# Patient Record
Sex: Male | Born: 1955 | Marital: Single | Smoking: Never smoker
Health system: Southern US, Community
[De-identification: ages and names within clinical notes are randomized; demographics above are authoritative.]

---

## 2007-05-09 ENCOUNTER — Emergency Department: Payer: Self-pay | Admitting: Emergency Medicine

## 2008-03-17 ENCOUNTER — Emergency Department: Payer: Self-pay | Admitting: Emergency Medicine

## 2008-03-19 ENCOUNTER — Emergency Department: Payer: Self-pay | Admitting: Emergency Medicine

## 2008-07-20 ENCOUNTER — Emergency Department: Payer: Self-pay | Admitting: Emergency Medicine

## 2008-09-11 IMAGING — CR DG CHEST 2V
1 series · 2 of 2 positions shown · non-contrast
Comparison: none

REASON FOR EXAM: pain
COMMENTS:

PROCEDURE:     DXR - DXR CHEST PA (OR AP) AND LATERAL  - May 09, 2007  [DATE]
RESULT:     No fracture, dislocation or other acute bony abnormality is
identified. No pneumothorax is seen. The chest appears mildly hyperexpanded
suspicious for history of asthma or COPD.

[Series 1: view not recorded · 0.17mm/px · 2 of 2 slices shown]
[im 1/2]
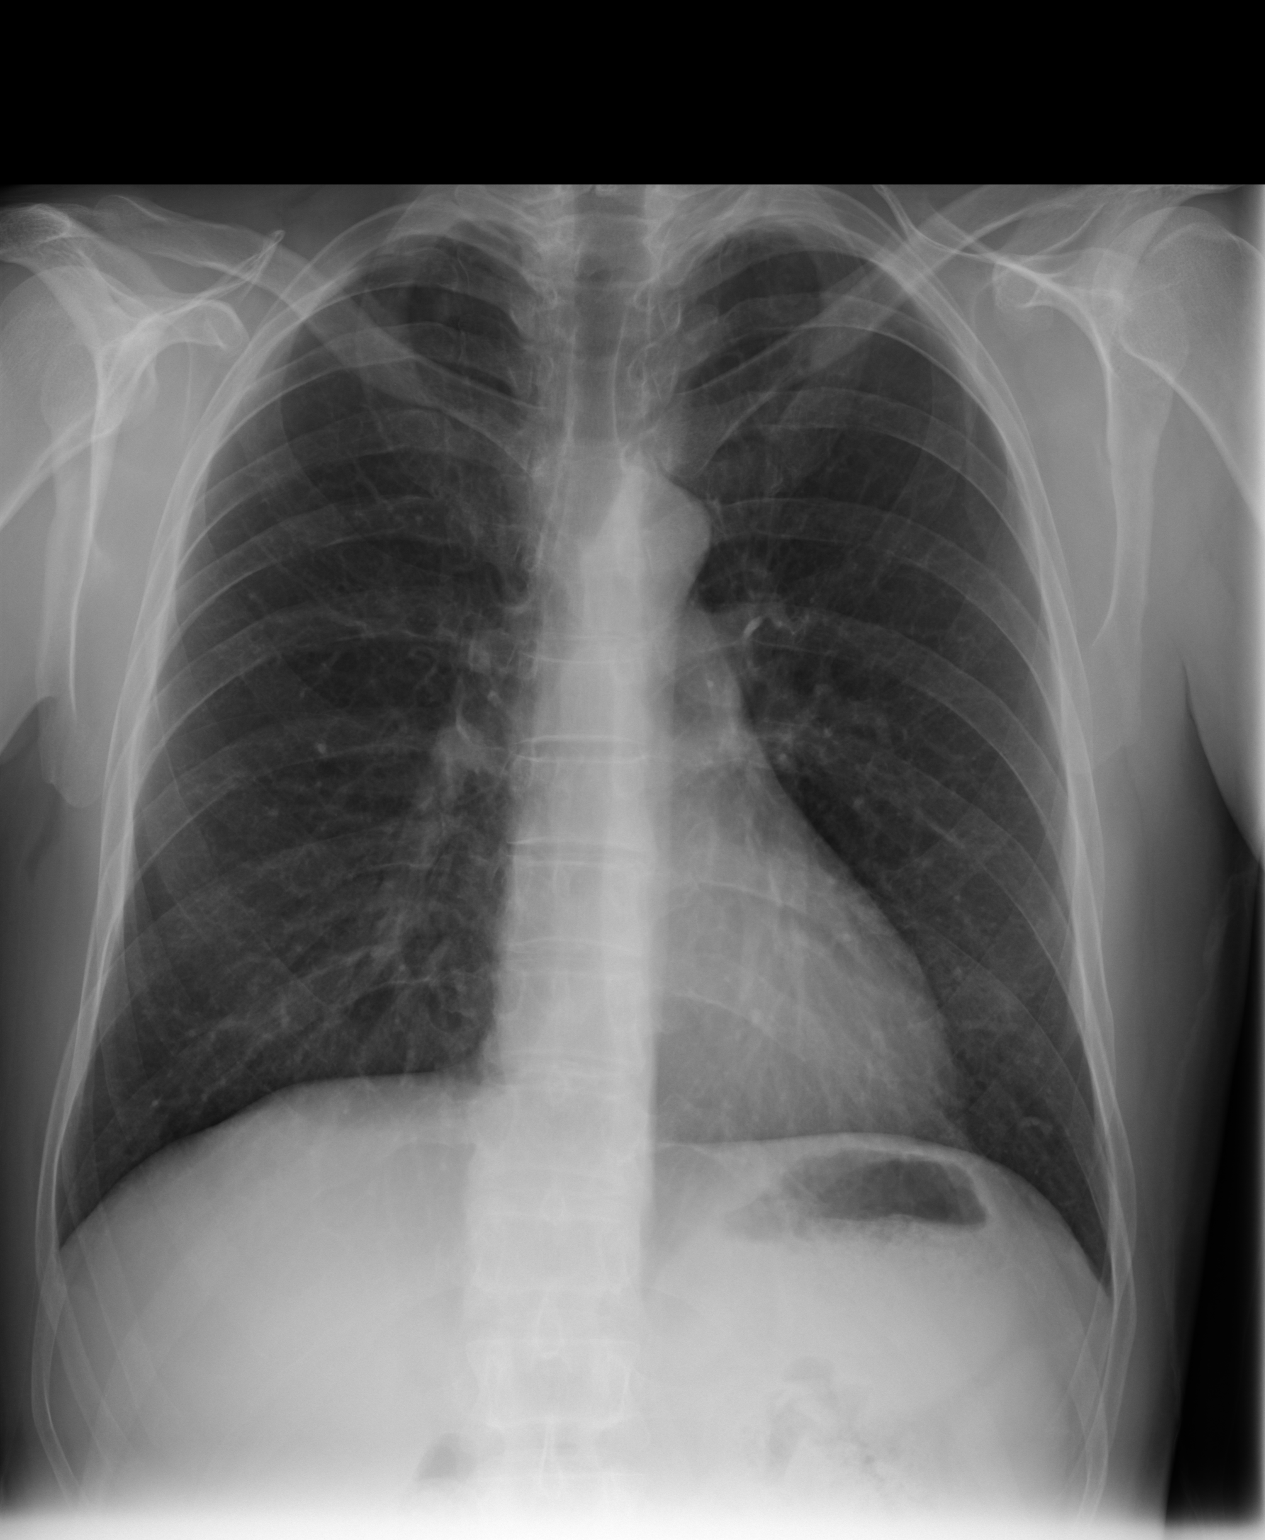
[im 2/2]
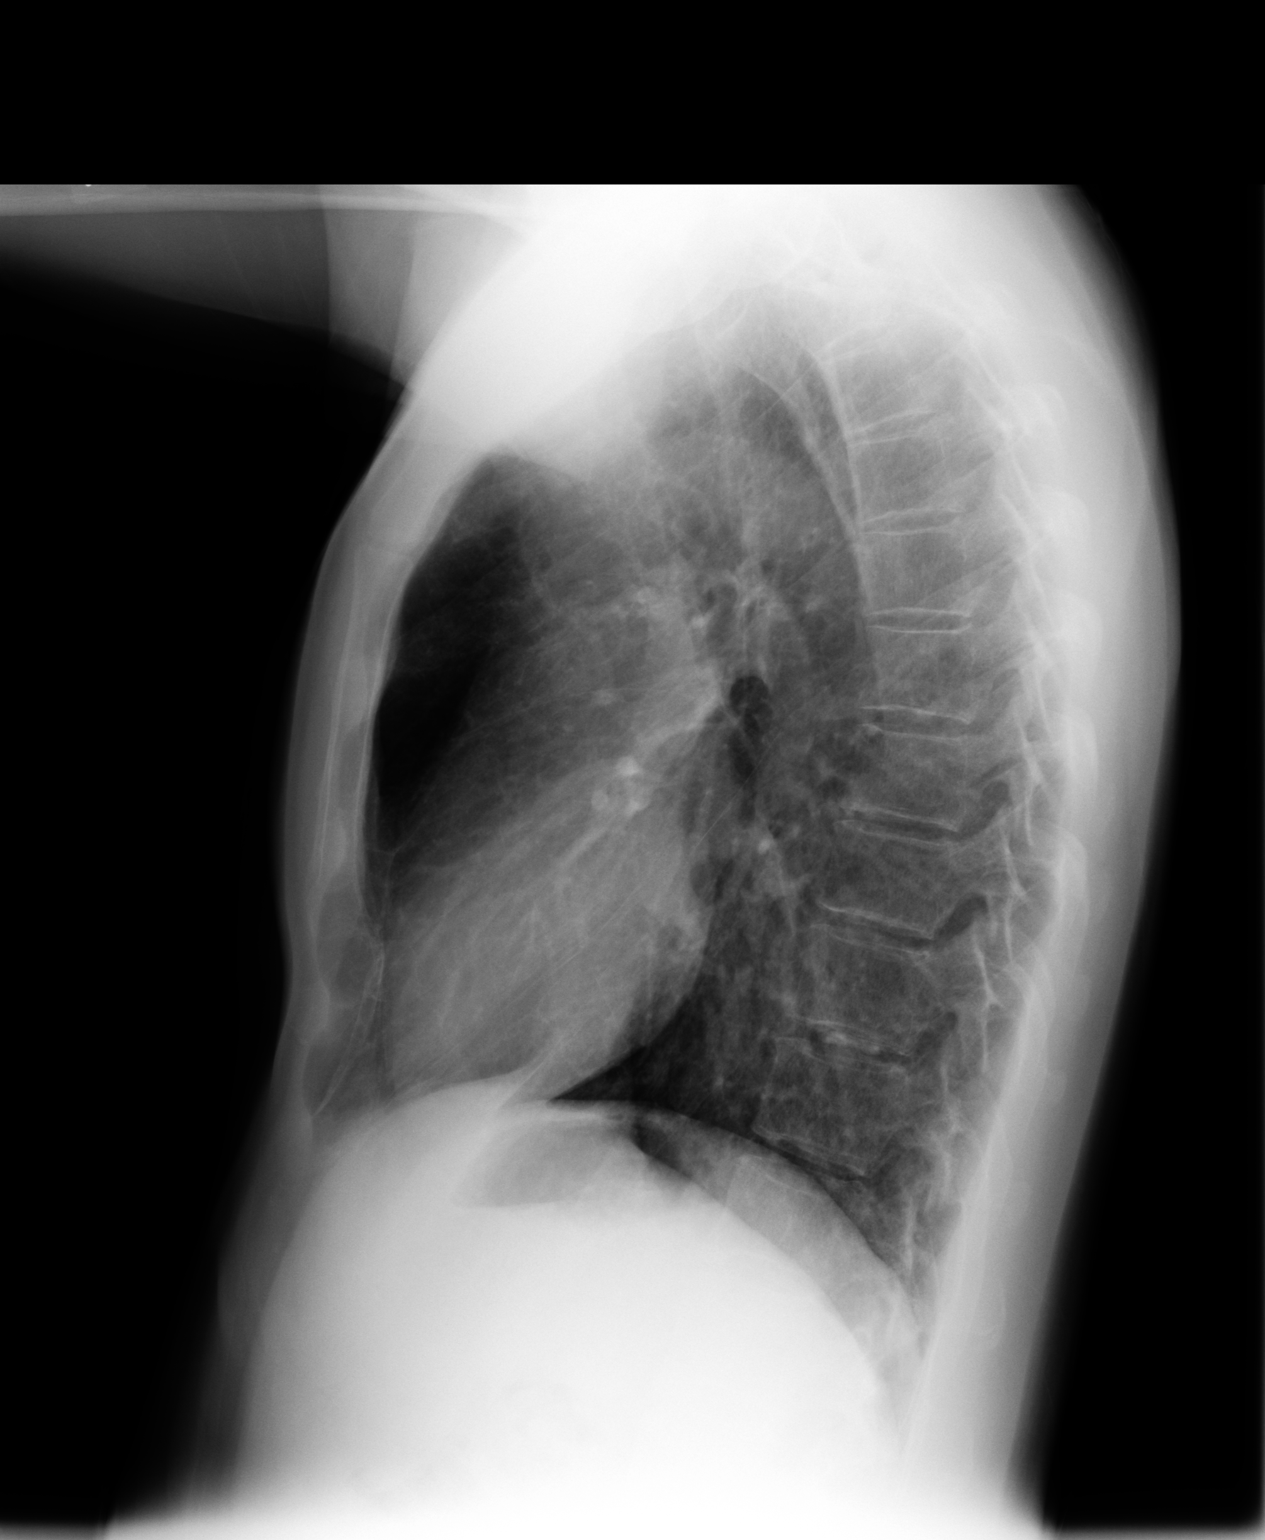

[2 of 2 positions shown; findings below may reference images not displayed]

IMPRESSION: 1.     No acute changes are identified.
2.     The chest appears mildly hyperexpanded.

## 2009-11-23 IMAGING — CR DG ANKLE COMPLETE 3+V*L*
1 series · 5 of 5 positions shown · non-contrast
Comparison: none

REASON FOR EXAM: twisting injury
COMMENTS:

PROCEDURE:     DXR - DXR ANKLE LEFT COMPLETE  - July 20, 2008  [DATE]
RESULT:     Five views of the LEFT ankle reveal the joint mortise to be
preserved. The talar dome is intact. The overlying soft tissues are grossly
normal.

[Series 1: view not recorded · 0.17mm/px · 5 of 5 slices shown]
[im 1/5]
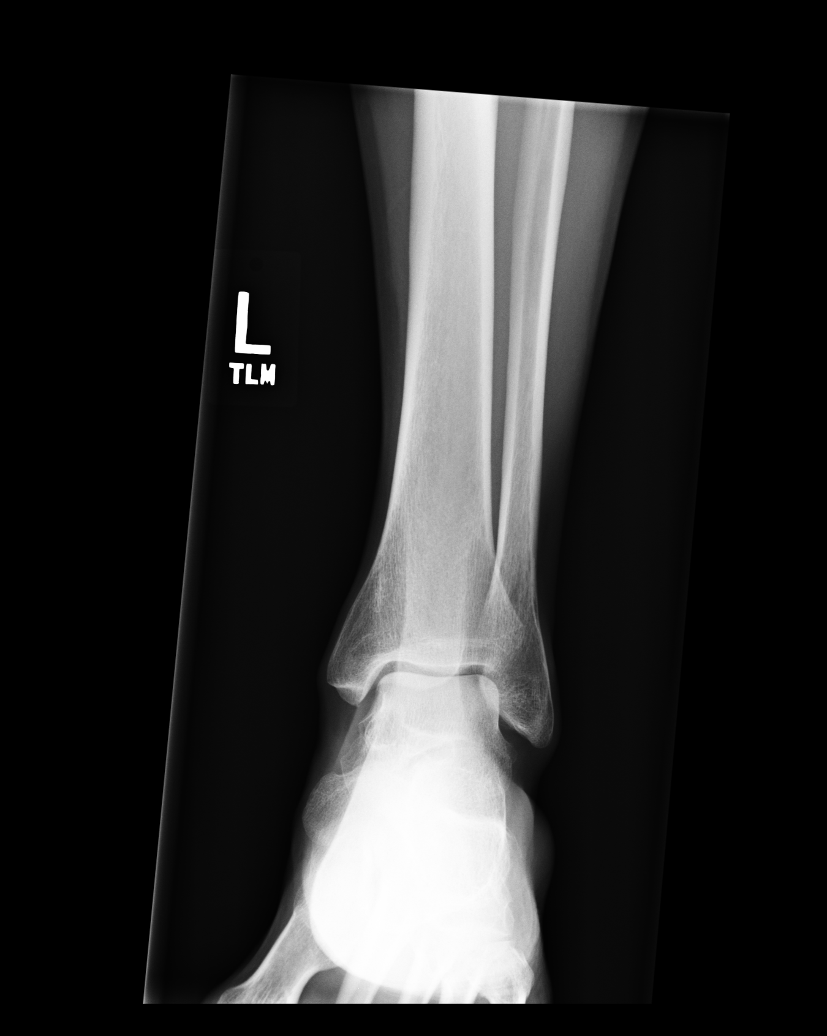
[im 2/5]
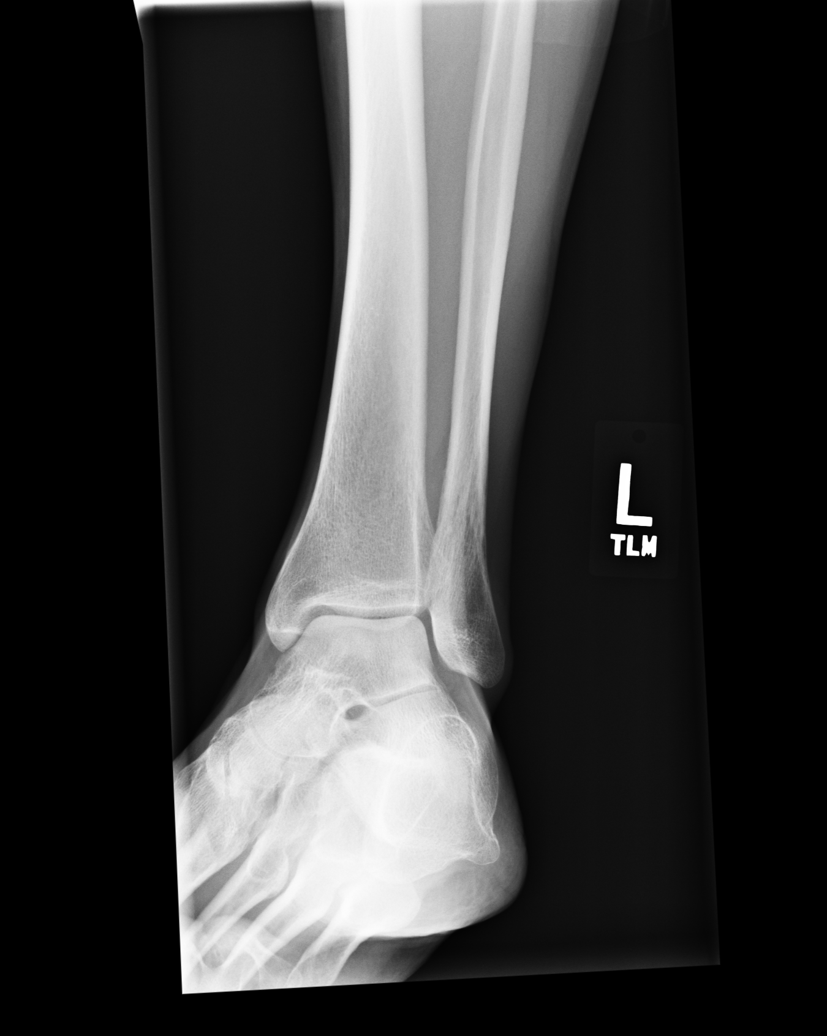
[im 3/5]
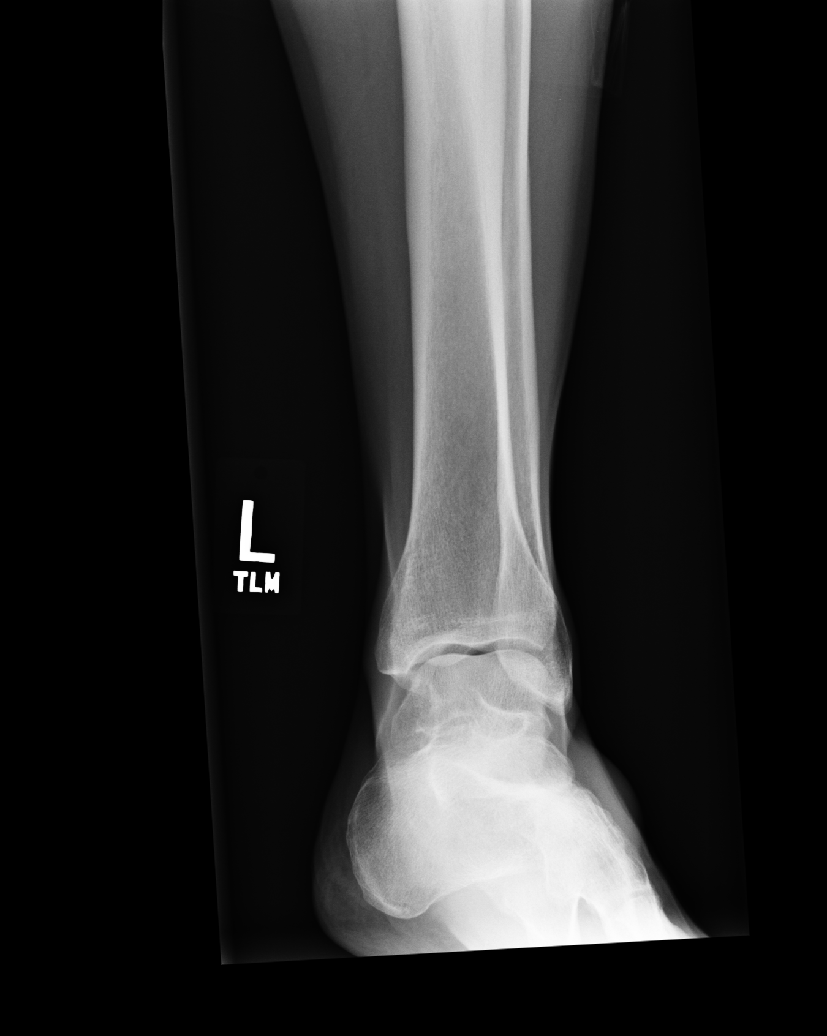
[im 4/5]
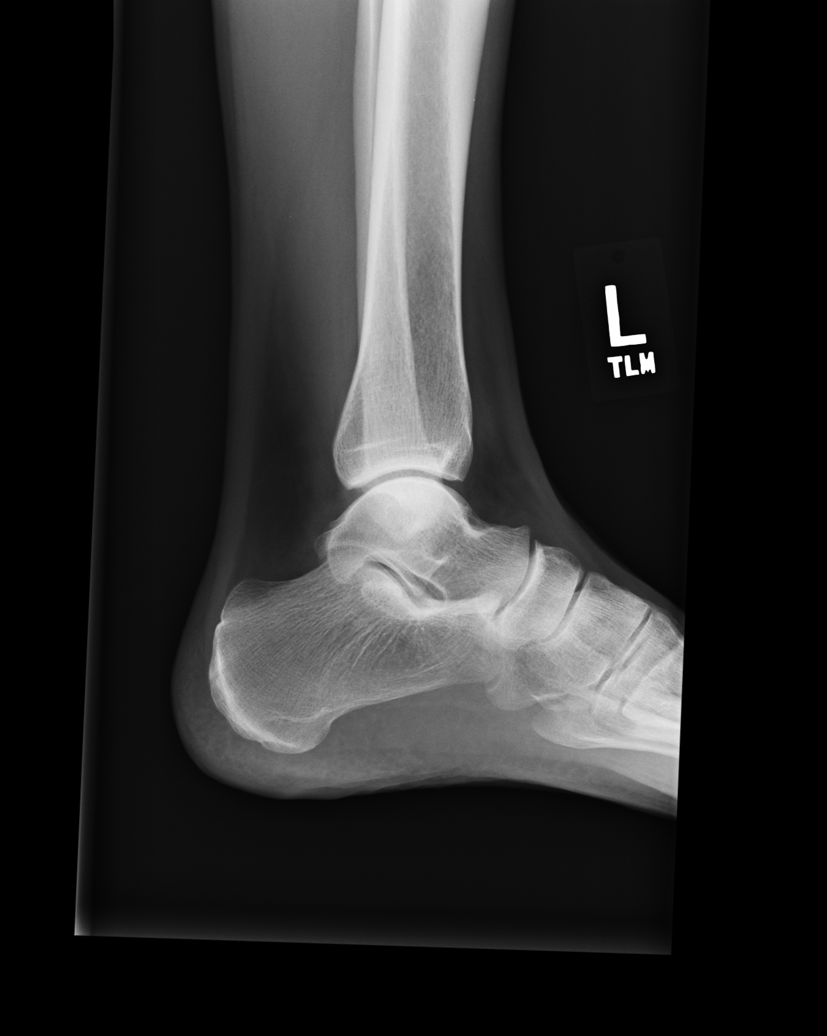
[im 5/5]
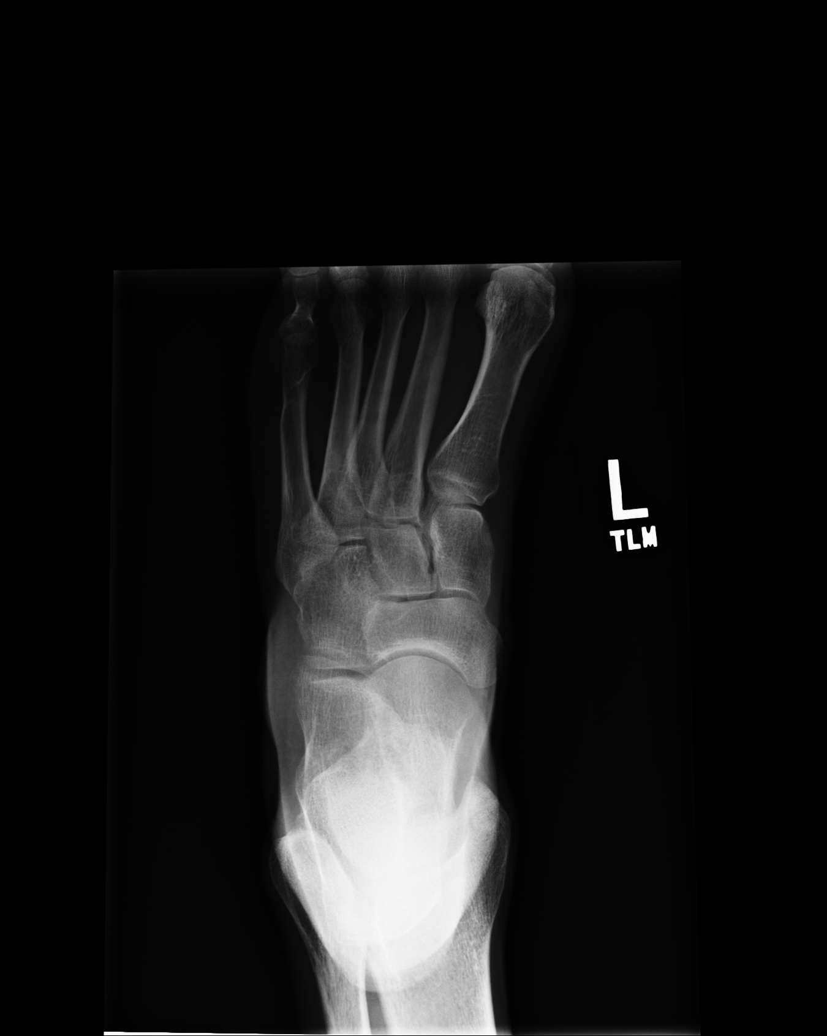

[5 of 5 positions shown; findings below may reference images not displayed]

IMPRESSION: I do not see objective evidence of an acute displaced
fracture of the ankle. Follow-up imaging is available if the patient's
symptoms do not resolve in a fashion consistent with an uncomplicated sprain.

## 2011-11-18 ENCOUNTER — Other Ambulatory Visit: Payer: Self-pay | Admitting: Family Medicine

## 2011-11-18 ENCOUNTER — Ambulatory Visit: Payer: Self-pay

## 2011-11-18 ENCOUNTER — Ambulatory Visit: Payer: Self-pay | Admitting: Family Medicine

## 2011-11-18 VITALS — BP 147/80 | HR 66 | Temp 97.8°F | Resp 16 | Ht 68.5 in | Wt 132.0 lb

## 2011-11-18 DIAGNOSIS — M79645 Pain in left finger(s): Secondary | ICD-10-CM

## 2011-11-18 DIAGNOSIS — M79609 Pain in unspecified limb: Secondary | ICD-10-CM

## 2011-11-18 LAB — POCT CBC
Granulocyte percent: 57.7 %G (ref 37–80)
HCT, POC: 45.3 % (ref 43.5–53.7)
Hemoglobin: 15.2 g/dL (ref 14.1–18.1)
MPV: 8.9 fL (ref 0–99.8)
POC Granulocyte: 2 (ref 2–6.9)
POC LYMPH PERCENT: 31.3 %L (ref 10–50)
RDW, POC: 13.4 %

## 2011-11-18 NOTE — Progress Notes (Signed)
Subjective: Patient was fine yesterday when he. He did his usual routine, exercise et Karie Soda. When he locks his chart going into work he noticed pain in the left index finger. He has continued to hurt and PIP joint and the whole distal two thirds of the finger. There is erythema of the lateral aspect of the of the distal joint. He does not know how he could have hurt it.  Objective: Lateral aspect of the left index finger is erythematous overlying the DIP joint. It's very tender there, with some tenderness on down to the PIP joint. No other joints are flared. He was wearing a little splint on it today.  Assessment: Pain and redness left index finger, etiology unclear  Plan: CBC and x-ray.  [facility] reading (PRIMARY) by  Dr. Alwyn Ren  No bony abnormalities seen . Results for orders placed in visit on 11/18/11  POCT CBC      Component Value Range   WBC 3.5 (*) 4.6 - 10.2 (K/uL)   Lymph, poc 1.1  0.6 - 3.4    POC LYMPH PERCENT 31.3  10 - 50 (%L)   MID (cbc) 0.4  0 - 0.9    POC MID % 11.0  0 - 12 (%M)   POC Granulocyte 2.0  2 - 6.9    Granulocyte percent 57.7  37 - 80 (%G)   RBC 5.17  4.69 - 6.13 (M/uL)   Hemoglobin 15.2  14.1 - 18.1 (g/dL)   HCT, POC 86.5  78.4 - 53.7 (%)   MCV 87.7  80 - 97 (fL)   MCH, POC 29.4  27 - 31.2 (pg)   MCHC 33.6  31.8 - 35.4 (g/dL)   RDW, POC 69.6     Platelet Count, POC 272  142 - 424 (K/uL)   MPV 8.9  0 - 99.8 (fL)   We'll treat symptomatically

## 2011-11-18 NOTE — Patient Instructions (Signed)
Take ibuprofen for pain and inflammation. Continue to keep the finger splinted for relief of discomfort. If it gets worse it rechecked.

## 2013-02-10 ENCOUNTER — Emergency Department: Payer: Self-pay | Admitting: Emergency Medicine

## 2013-03-23 IMAGING — CR DG FINGER INDEX 2+V*L*
1 series · 1 of 1 positions shown · non-contrast
Comparison: None.

CLINICAL DATA: Pain in the left index finger.  Swelling.  Erythema.
No known injury.

LEFT HAND - 2 VIEW

[PA]
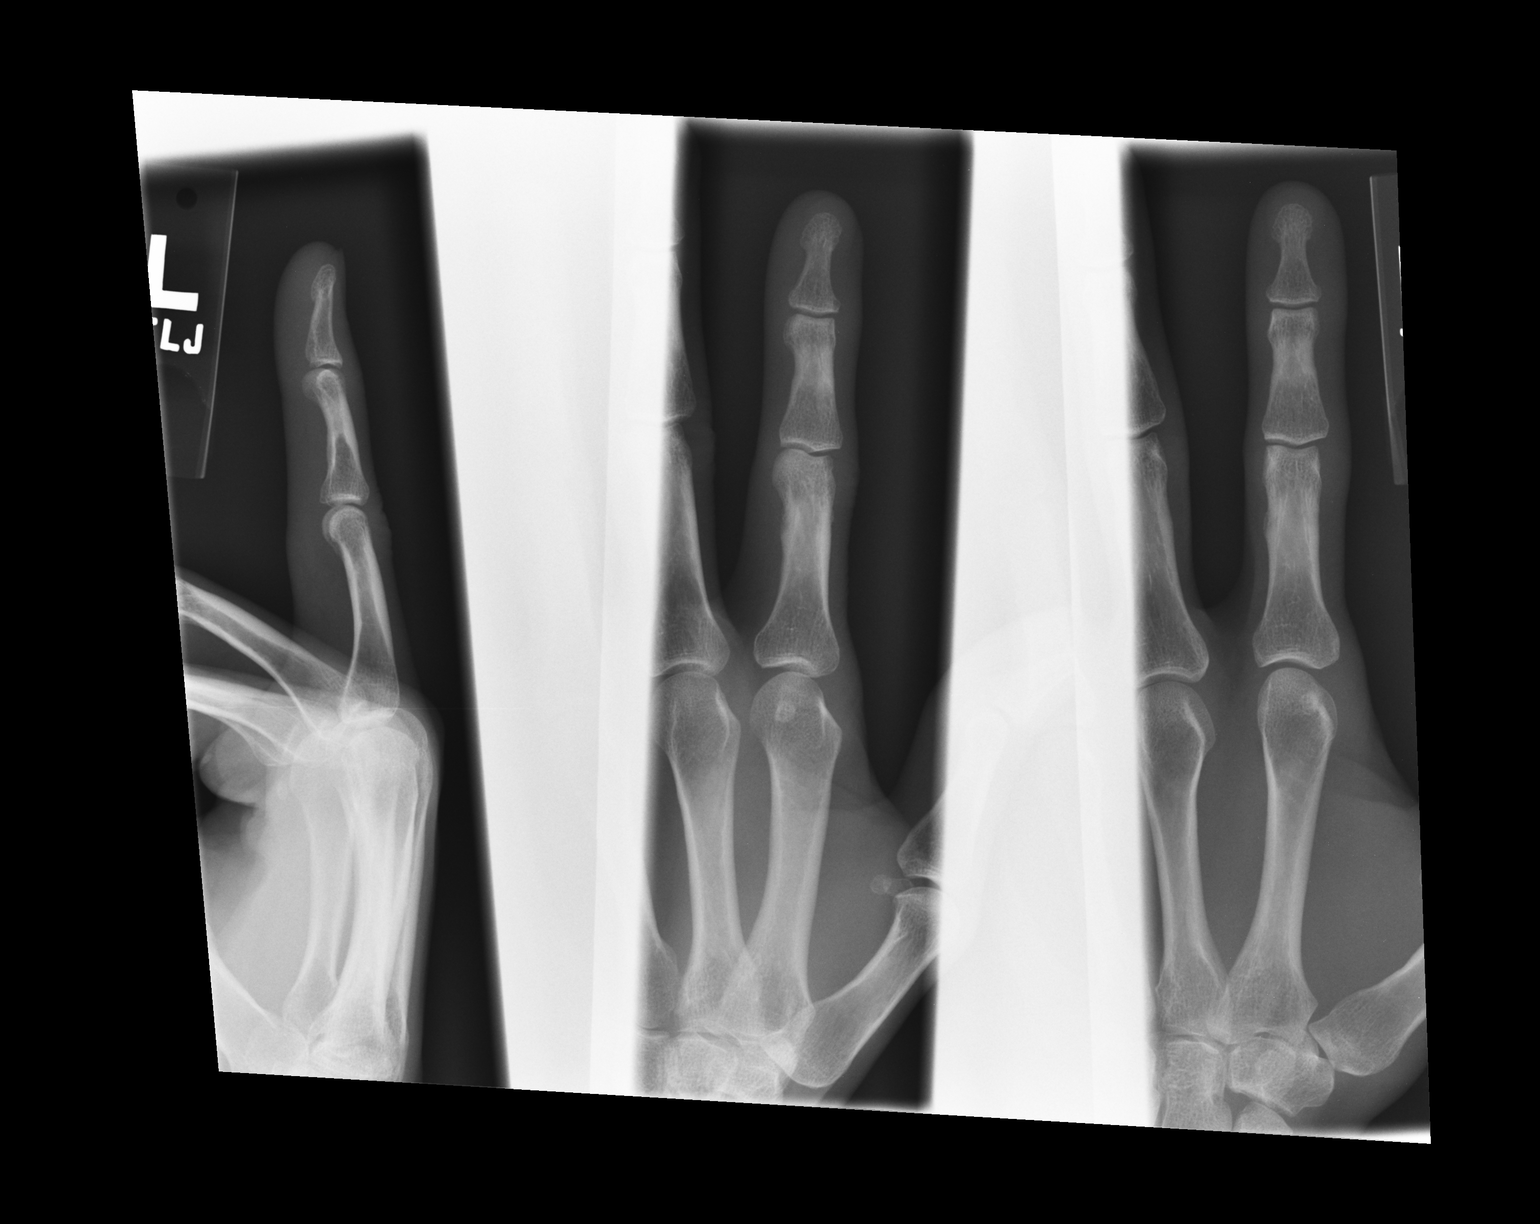

[1 of 1 positions shown; findings below may reference images not displayed]

FINDINGS: Alignment is normal.  Joint spaces are preserved.  No
fracture or dislocation is evident.  No soft tissue lesions are
seen.  No opaque foreign body is evident peri
IMPRESSION: No abnormality is identified.

## 2015-05-15 ENCOUNTER — Telehealth: Payer: Self-pay

## 2015-05-15 NOTE — Telephone Encounter (Signed)
Pt states we are not his primary care. Can we do this for pt?

## 2015-05-15 NOTE — Telephone Encounter (Signed)
Left message on machine advising pt

## 2015-05-15 NOTE — Telephone Encounter (Signed)
If patient has a PCP or specialist managing the issue that may result in the patient's death, that person would be more appropriate.  However, if the patient wants to discuss the possibility of [place] completing the form, needs to come in. We saw her once in 2013 for an acute issue.

## 2015-05-15 NOTE — Telephone Encounter (Signed)
Patient is calling to see if we can fill out a DNR form. It was advised that I put in a message for clinical to call and advise the patient
